# Patient Record
Sex: Male | Born: 1950 | Race: White | Hispanic: No | Marital: Married
Health system: Southern US, Community
[De-identification: ages and names within clinical notes are randomized; demographics above are authoritative.]

---

## 1997-11-22 ENCOUNTER — Emergency Department (HOSPITAL_COMMUNITY): Admission: EM | Admit: 1997-11-22 | Discharge: 1997-11-23 | Payer: Self-pay

## 1998-11-13 ENCOUNTER — Emergency Department (HOSPITAL_COMMUNITY): Admission: EM | Admit: 1998-11-13 | Discharge: 1998-11-14 | Payer: Self-pay | Admitting: Emergency Medicine

## 1998-11-14 ENCOUNTER — Encounter: Payer: Self-pay | Admitting: Emergency Medicine

## 2015-08-27 ENCOUNTER — Other Ambulatory Visit: Payer: Self-pay | Admitting: Orthopaedic Surgery

## 2015-08-27 DIAGNOSIS — M25562 Pain in left knee: Secondary | ICD-10-CM

## 2015-09-04 ENCOUNTER — Ambulatory Visit
Admission: RE | Admit: 2015-09-04 | Discharge: 2015-09-04 | Disposition: A | Discharge status: Home / Self Care | Payer: Medicare Other | Source: Ambulatory Visit | Attending: Orthopaedic Surgery | Admitting: Orthopaedic Surgery

## 2015-09-04 DIAGNOSIS — M25562 Pain in left knee: Secondary | ICD-10-CM

## 2016-03-15 ENCOUNTER — Ambulatory Visit (INDEPENDENT_AMBULATORY_CARE_PROVIDER_SITE_OTHER): Payer: Medicare Other | Admitting: Orthopaedic Surgery

## 2016-03-15 DIAGNOSIS — M1712 Unilateral primary osteoarthritis, left knee: Secondary | ICD-10-CM

## 2017-03-07 ENCOUNTER — Ambulatory Visit (INDEPENDENT_AMBULATORY_CARE_PROVIDER_SITE_OTHER): Payer: Medicare Other | Admitting: Orthopaedic Surgery

## 2017-03-07 DIAGNOSIS — M25562 Pain in left knee: Secondary | ICD-10-CM

## 2017-03-07 DIAGNOSIS — G8929 Other chronic pain: Secondary | ICD-10-CM | POA: Diagnosis not present

## 2017-03-07 DIAGNOSIS — M1712 Unilateral primary osteoarthritis, left knee: Secondary | ICD-10-CM | POA: Diagnosis not present

## 2017-03-07 MED ORDER — LIDOCAINE HCL 1 % IJ SOLN
3.0000 mL | INTRAMUSCULAR | Status: AC | PRN
  Administered 2017-03-07: 3 mL

## 2017-03-07 MED ORDER — METHYLPREDNISOLONE ACETATE 40 MG/ML IJ SUSP
40.0000 mg | INTRAMUSCULAR | Status: AC | PRN
  Administered 2017-03-07: 40 mg via INTRA_ARTICULAR

## 2017-03-07 NOTE — Progress Notes (Signed)
Office Visit Note   Patient: Terrance Carr           Date of Birth: 1950-08-23           MRN: [id number] Visit Date: 03/07/2017              Requested by: No referring provider defined for this encounter. PCP: No primary care provider on file.   Assessment & Plan: Visit Diagnoses:  1. Chronic pain of left knee   2. Unilateral primary osteoarthritis, left knee     Plan: I agree with trying a steroid injection again today and then ordering hyaluronic acid for his need to place in his left knee in 4 weeks now. He understands fully the risks and benefits of these injections. He tolerated the steroid injection his left knee well. We'll see him back in 4 weeks to provide a hyaluronic acid injection in his left knee.  Follow-Up Instructions: Return in about 4 weeks (around 04/04/2017).   Orders:  Orders Placed This Encounter  Procedures  . Large Joint Injection/Arthrocentesis   No orders of the defined types were placed in this encounter.     Procedures: Large Joint Inj Date/Time: 03/07/2017 11:02 AM Performed by: Kathryne Hitch Authorized by: Kathryne Hitch   Location:  Knee Site:  L knee Ultrasound Guidance: No   Fluoroscopic Guidance: No   Arthrogram: No   Medications:  3 mL lidocaine 1 %; 40 mg methylPREDNISolone acetate 40 MG/ML     Clinical Data: No additional findings.   Subjective: No chief complaint on file. The patient is well-known to [place]. We have not seen him in about a year. He has known osteoarthritis and degenerative joint disease of left knee. He has had pain flaring up again on that knee would like to consider steroid injection again and then later on hyaluronic acid injection because he had one of those last year that helped as well. His pain is daily and it hurts more with his activities daily living in terms of mobility of the left knee. Injections of helped in the past. He still not ready for knee replacement surgery yet. He is 66  years old wait until he is older and retired. Obviously if the injections are still helping I agree with this the way to continue to go. He has had no other acute changes his medical status.  HPI  Review of Systems He denies any headache, chest pain, shortness of breath, fever, chills, nausea, vomiting  Objective: Vital Signs: There were no vitals taken for this visit.  Physical Exam He is alert or 3 and in no acute distress Ortho Exam Examination of his left knee shows varus malalignment. He has excellent range of motion of that knee and it is ligamentously stable. He has medial joint line tenderness and some slight patellofemoral crepitation. Specialty Comments:  No specialty comments available.  Imaging: No results found.   PMFS History: Patient Active Problem List   Diagnosis Date Noted  . Unilateral primary osteoarthritis, left knee 03/07/2017  . Chronic pain of left knee 03/07/2017   No past medical history on file.  No family history on file.  No past surgical history on file. Social History   Occupational History  . Not on file.   Social History Main Topics  . Smoking status: Not on file  . Smokeless tobacco: Not on file  . Alcohol use Not on file  . Drug use: Unknown  . Sexual activity: Not on file

## 2017-04-04 ENCOUNTER — Encounter (INDEPENDENT_AMBULATORY_CARE_PROVIDER_SITE_OTHER): Payer: Self-pay | Admitting: Orthopaedic Surgery

## 2017-04-04 ENCOUNTER — Ambulatory Visit (INDEPENDENT_AMBULATORY_CARE_PROVIDER_SITE_OTHER): Payer: Medicare Other | Admitting: Orthopaedic Surgery

## 2017-04-04 DIAGNOSIS — M1712 Unilateral primary osteoarthritis, left knee: Secondary | ICD-10-CM

## 2017-04-04 MED ORDER — HYALURONAN 88 MG/4ML IX SOSY
88.0000 mg | PREFILLED_SYRINGE | INTRA_ARTICULAR | Status: AC | PRN
  Administered 2017-04-04: 88 mg via INTRA_ARTICULAR

## 2017-04-04 NOTE — Progress Notes (Signed)
   Procedure Note  Patient: Terrance Carr             Date of Birth: Jul 23, 1950           MRN: [id number]             Visit Date: 04/04/2017  Procedures: Visit Diagnoses: Unilateral primary osteoarthritis, left knee  Large Joint Inj: L knee on 04/04/2017 8:43 AM Indications: pain and diagnostic evaluation Details: 22 G 1.5 in needle, superolateral approach  Arthrogram: No  Medications: 88 mg Hyaluronan 88 MG/4ML Outcome: tolerated well, no immediate complications Procedure, treatment alternatives, risks and benefits explained, specific risks discussed. Consent was given by the patient. Immediately prior to procedure a time out was called to verify the correct patient, procedure, equipment, support staff and site/side marked as required. Patient was prepped and draped in the usual sterile fashion.    The patient is coming in today for scheduled hyaluronic acid injections in his left knee to treat moderate arthritic pain.  This is been well documented and he is already tried a steroid injection in that knee.  His pain is daily and it is affecting detrimentally his activities daily living, quality of life, his mobility.  We had talked already about hyaluronic acid injection he was given a handout on this prior to this admission.  He agreed to have this done so we have ordered an approved.  He is here to have that injection today.  His knee pain is mainly at the medial compartment of his knee.  He has full range of motion of the knee with the knee feels voluminously stable as well.  There is some slight patellofemoral crepitation with no effusion today.  He tolerated the Monovisc injection on his left knee.  We will see him back as needed or at least 2 months to see if this injection is helped always consider steroid injection in the future.  All questions and concerns were answered and addressed.

## 2018-01-11 IMAGING — MR MR KNEE*L* W/O CM
4 of 5 series · 19 of 40 positions shown · non-contrast
Comparison: None.

CLINICAL DATA: Left knee pain with swelling and popping.

EXAM:
MRI OF THE LEFT KNEE WITHOUT CONTRAST
TECHNIQUE: Multiplanar, multisequence MR imaging of the knee was performed. No
intravenous contrast was administered.

[Series 3: [id number]_fs_tra · axial · 3.5mm · 0.42mm/px · z∈[-34,+37]mm · 3 of 25 slices shown]
[im 4/25]
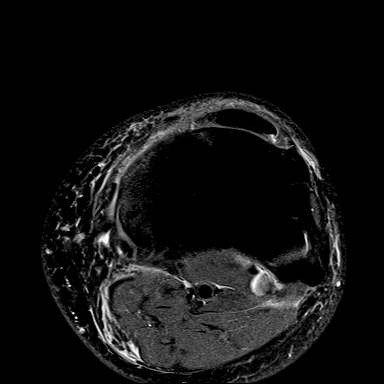
[im 14/25]
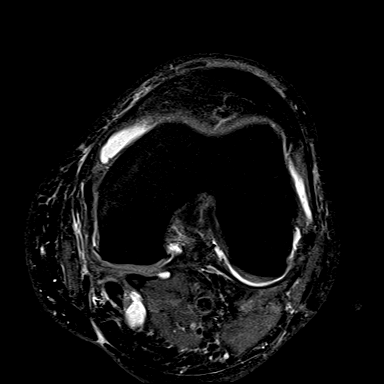
[im 21/25]
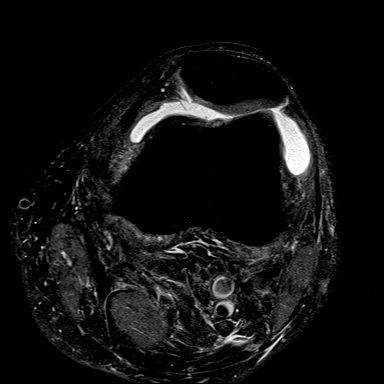

[Series 5: T2 fat-sat · coronal · 3.2mm · 0.62mm/px · 5 of 28 slices shown]
[im 1/28]
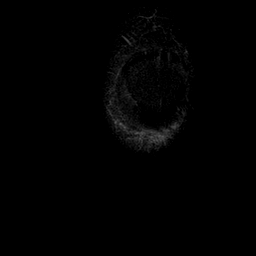
[im 4/28]
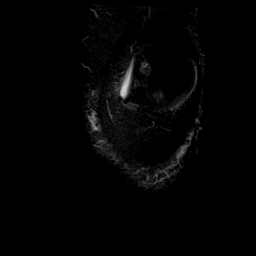
[im 8/28]
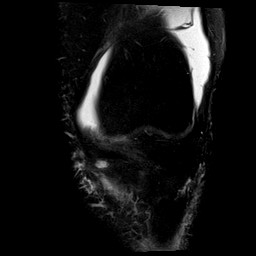
[im 16/28]
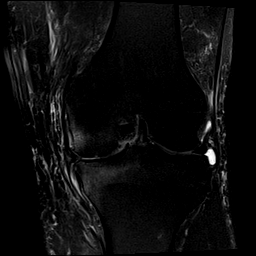
[im 24/28]
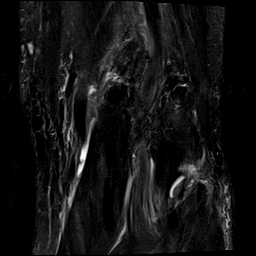

[Series 6: T1 · coronal · 3.2mm · 0.25mm/px · 3 of 28 slices shown]
[im 4/28]
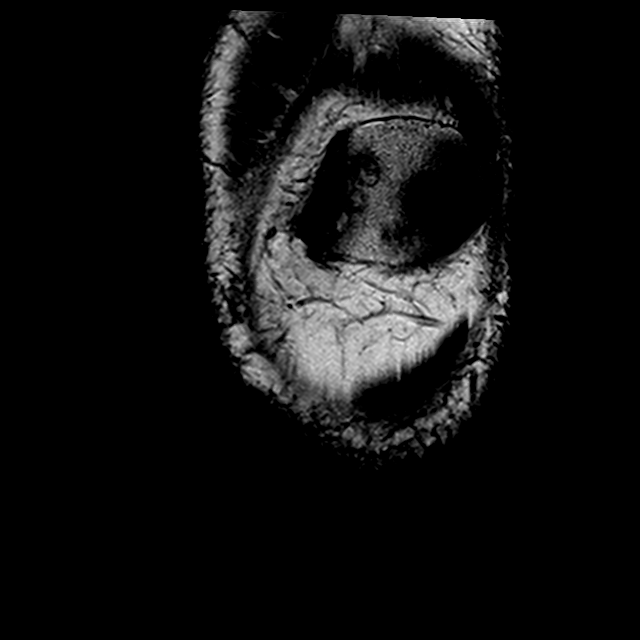
[im 16/28]
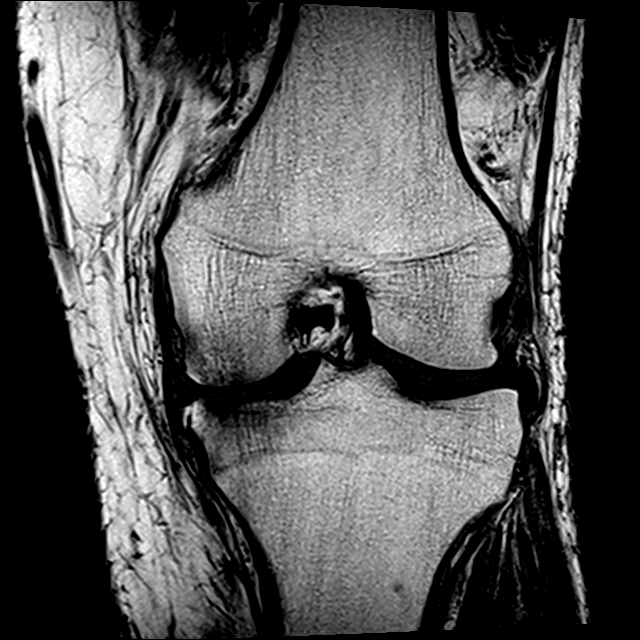
[im 24/28]
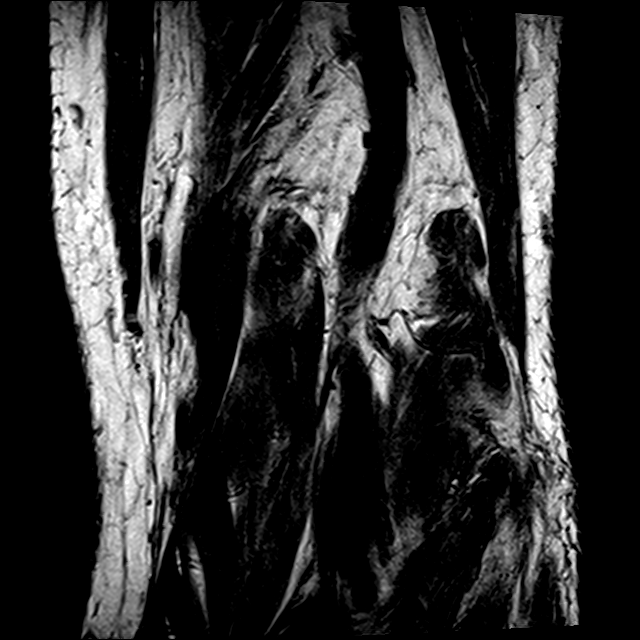

[Series 7: PD fat-sat · sagittal · 3.5mm · 0.25mm/px · 8 of 27 slices shown]
[im 1/27]
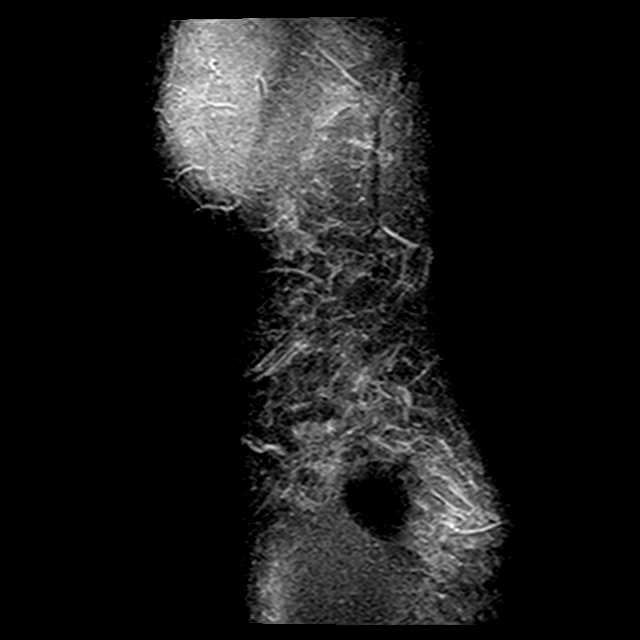
[im 4/27]
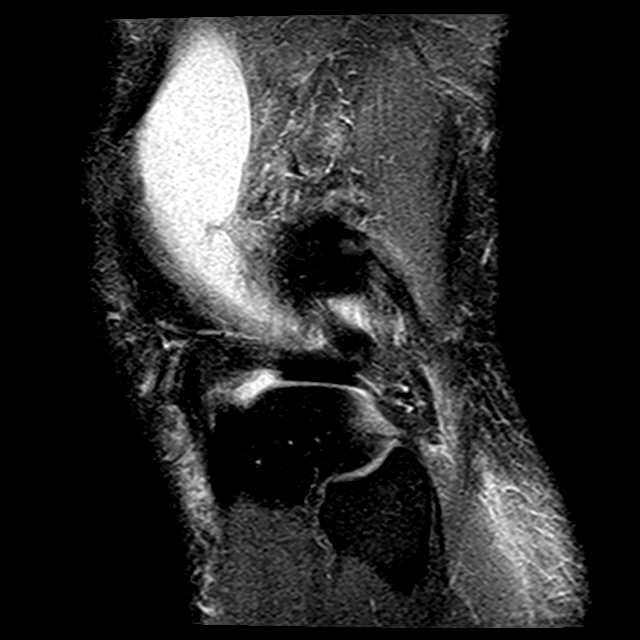
[im 8/27]
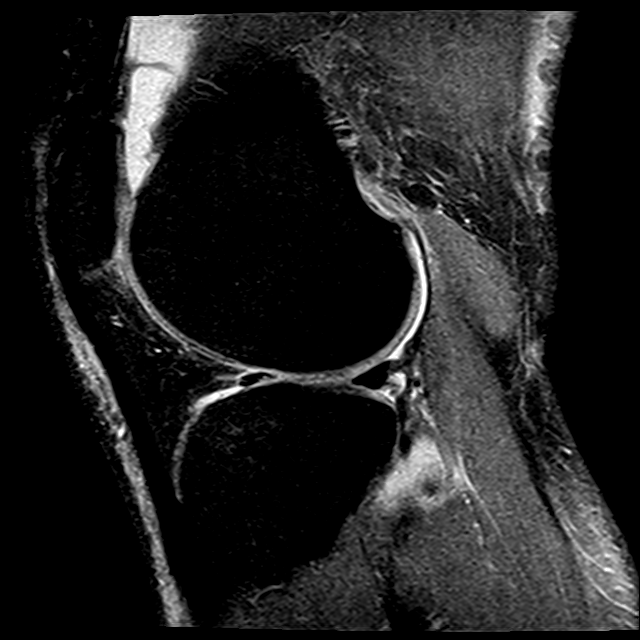
[im 12/27]
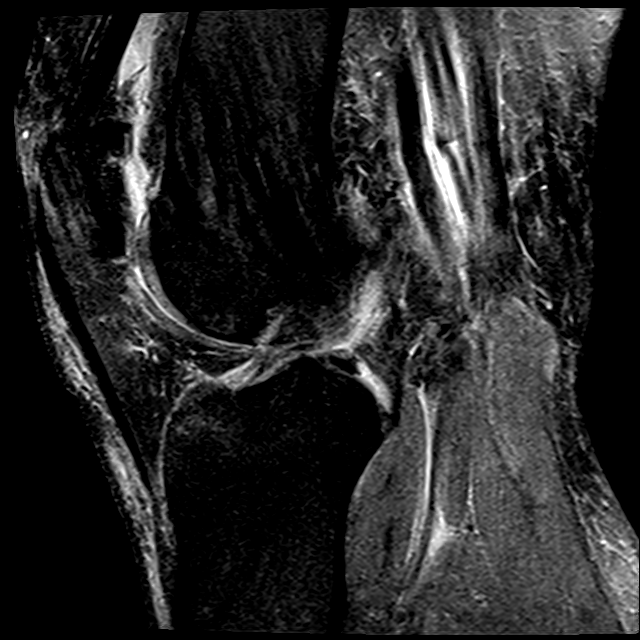
[im 15/27]
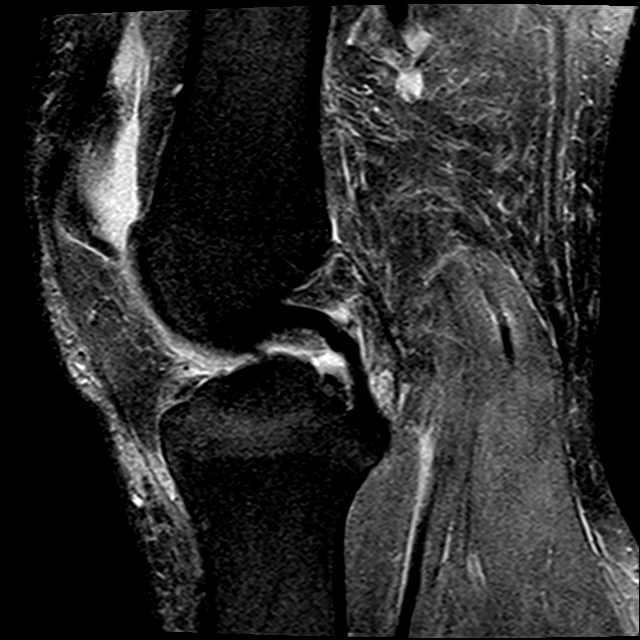
[im 19/27]
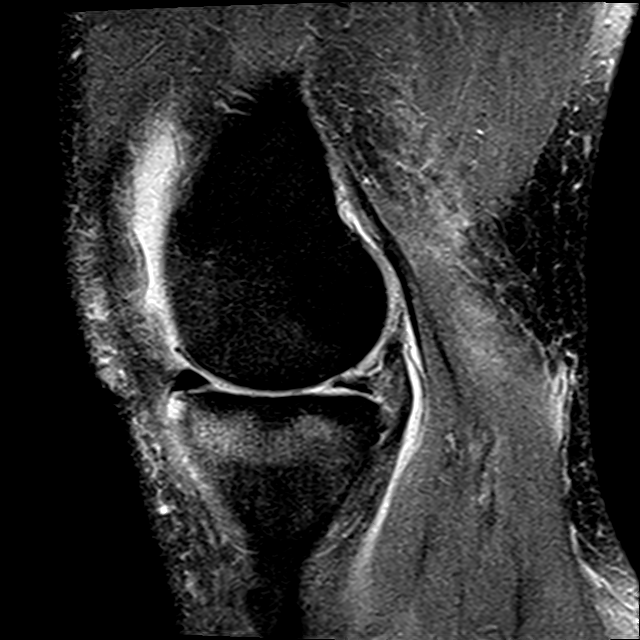
[im 23/27]
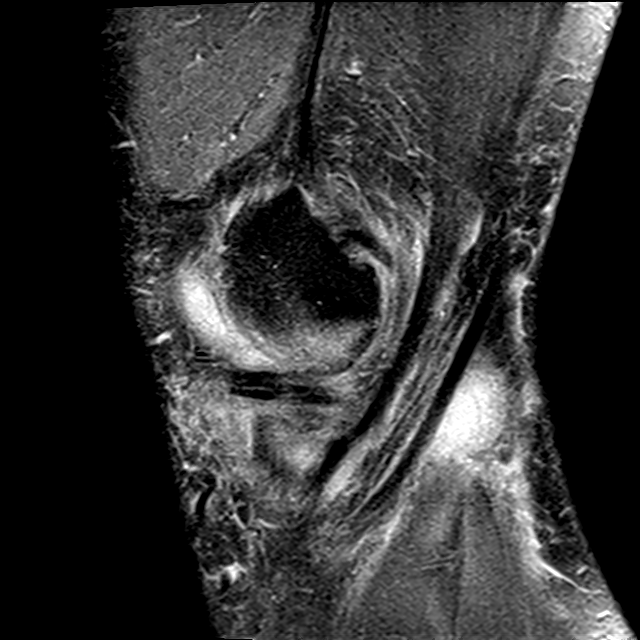
[im 27/27]
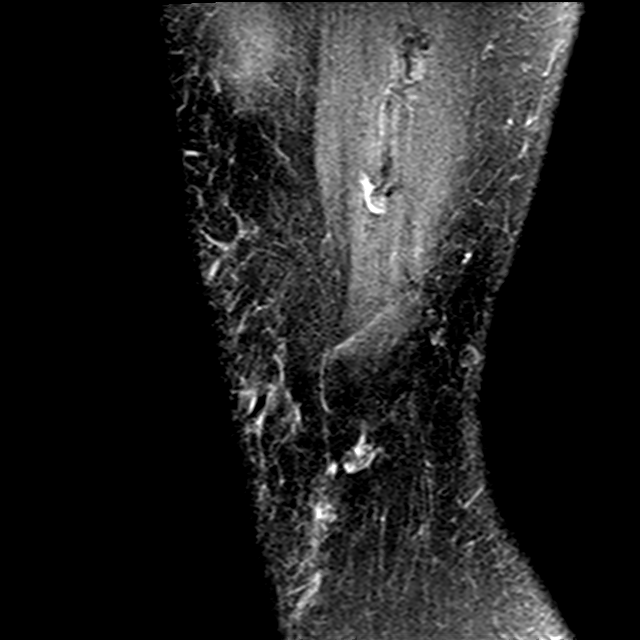

[19 of 40 positions shown; findings below may reference images not displayed]

FINDINGS: MENISCI

Medial meniscus: Complex tear of the posterior horn- body of the
medial meniscus.

Lateral meniscus:  Intact.

LIGAMENTS

Cruciates:  Intact ACL and PCL.

Collaterals: Medial collateral ligament is intact. Lateral
collateral ligament complex is intact.

CARTILAGE

Patellofemoral: Partial thickness cartilage loss of the medial
patellar facet with cartilage fissuring. Partial-thickness cartilage
loss of the lateral patellar facet.

Medial: High-grade partial-thickness cartilage loss with areas of
full-thickness cartilage loss of the medial femoral condyle and
medial tibial plateau with subchondral reactive marrow changes.

Lateral:  Chondromalacia of the lateral femorotibial compartment.

Joint:  Moderate joint effusion.  No plical thickening.

Popliteal Fossa: Small Baker cyst mild edema superficial to the
medial gastrocnemius muscle likely reflecting a leaking Baker cyst.
Intact popliteus tendon.

Extensor Mechanism:  Intact.

Bones: No other marrow signal abnormality. No acute fracture or
dislocation.
IMPRESSION: 1. Complex tear of the posterior horn-body of the medial meniscus.
2. Tricompartmental cartilage abnormalities most severe in the
medial femorotibial compartment as described above.

## 2018-05-01 ENCOUNTER — Ambulatory Visit (INDEPENDENT_AMBULATORY_CARE_PROVIDER_SITE_OTHER): Payer: Self-pay

## 2018-05-01 ENCOUNTER — Telehealth (INDEPENDENT_AMBULATORY_CARE_PROVIDER_SITE_OTHER): Payer: Self-pay

## 2018-05-01 ENCOUNTER — Ambulatory Visit (INDEPENDENT_AMBULATORY_CARE_PROVIDER_SITE_OTHER): Payer: Medicare Other

## 2018-05-01 ENCOUNTER — Ambulatory Visit (INDEPENDENT_AMBULATORY_CARE_PROVIDER_SITE_OTHER): Payer: Medicare Other | Admitting: Orthopaedic Surgery

## 2018-05-01 DIAGNOSIS — M25561 Pain in right knee: Secondary | ICD-10-CM

## 2018-05-01 DIAGNOSIS — M1711 Unilateral primary osteoarthritis, right knee: Secondary | ICD-10-CM

## 2018-05-01 DIAGNOSIS — M1712 Unilateral primary osteoarthritis, left knee: Secondary | ICD-10-CM | POA: Diagnosis not present

## 2018-05-01 DIAGNOSIS — M25562 Pain in left knee: Secondary | ICD-10-CM

## 2018-05-01 MED ORDER — METHYLPREDNISOLONE ACETATE 40 MG/ML IJ SUSP
40.0000 mg | INTRAMUSCULAR | Status: AC | PRN
  Administered 2018-05-01: 40 mg via INTRA_ARTICULAR

## 2018-05-01 MED ORDER — LIDOCAINE HCL 1 % IJ SOLN
3.0000 mL | INTRAMUSCULAR | Status: AC | PRN
  Administered 2018-05-01: 3 mL

## 2018-05-01 MED ORDER — METHYLPREDNISOLONE 4 MG PO TABS: ORAL_TABLET | ORAL | 0 refills | Status: AC

## 2018-05-01 MED ORDER — GABAPENTIN 300 MG PO CAPS: 300.0000 mg | ORAL_CAPSULE | Freq: Every day | ORAL | 1 refills | Status: DC

## 2018-05-01 NOTE — Progress Notes (Signed)
Office Visit Note   Patient: Terrance Carr           Date of Birth: February 24, 1951           MRN: [id number] Visit Date: 05/01/2018              Requested by: Medicine, [facility] No address on file PCP: Medicine, [facility]   Assessment & Plan: Visit Diagnoses:  1. Right knee pain, unspecified chronicity   2. Left knee pain, unspecified chronicity   3. Unilateral primary osteoarthritis, left knee   4. Unilateral primary osteoarthritis, right knee     Plan: I did place steroid injections in both knees today per his wishes.  He is a candidate for hyaluronic acid in both knees since the last injection lasted him for so long.  As far as his numbness in his left foot goes, I will try Neurontin 300 mg at night for the next month.  We will see him back in 4 weeks to hopefully place a hyaluronic acid injection in both knees.  All questions were answered and addressed.  We will see him back in 4 weeks.  Follow-Up Instructions: Return in about 4 weeks (around 05/29/2018).   Orders:  Orders Placed This Encounter  Procedures  . Large Joint Inj  . Large Joint Inj  . XR Knee 1-2 Views Left  . XR Knee 1-2 Views Right   No orders of the defined types were placed in this encounter.     Procedures: Large Joint Inj: R knee on 05/01/2018 4:05 PM Indications: diagnostic evaluation and pain Details: 22 G 1.5 in needle, superolateral approach  Arthrogram: No  Medications: 3 mL lidocaine 1 %; 40 mg methylPREDNISolone acetate 40 MG/ML Outcome: tolerated well, no immediate complications Procedure, treatment alternatives, risks and benefits explained, specific risks discussed. Consent was given by the patient. Immediately prior to procedure a time out was called to verify the correct patient, procedure, equipment, support staff and site/side marked as required. Patient was prepped and draped in the usual sterile fashion.   Large Joint Inj: L knee on  05/01/2018 4:06 PM Indications: diagnostic evaluation and pain Details: 22 G 1.5 in needle, superolateral approach  Arthrogram: No  Medications: 3 mL lidocaine 1 %; 40 mg methylPREDNISolone acetate 40 MG/ML Outcome: tolerated well, no immediate complications Procedure, treatment alternatives, risks and benefits explained, specific risks discussed. Consent was given by the patient. Immediately prior to procedure a time out was called to verify the correct patient, procedure, equipment, support staff and site/side marked as required. Patient was prepped and draped in the usual sterile fashion.       Clinical Data: No additional findings.   Subjective: Chief Complaint  Patient presents with  . Left Knee - Pain  . Right Knee - Pain  The patient is well-known to me.  It is been a while since we seen him though.  We last placed Monovisc in his left knee in 2018 and is done well for a long period of time.  For last for 5 months he is developed worsening left knee pain and some right knee pain now.  The left knee pain is definitely on the medial joint line where he has known severe osteoarthritis.  There is grinding in this area as well.  The right knee hurts globally around the patella itself.  His knee is had no injury to either knee.  About 4 months ago he did twist his right  knee and got his foot caught and since then has had numbness in the lateral aspect of his left foot.  He denies any weakness but definitely the sensation is diminished significantly.  He denies any back issues.  He is not a diabetic.  He denies any change in bowel bladder function.  HPI  Review of Systems He currently denies any headache, chest pain, shortness of breath, fever, chills, nausea, vomiting.  Objective: Vital Signs: There were no vitals taken for this visit.  Physical Exam He is alert or x3 and in no acute distress Ortho Exam Examination of his left knee shows significant patellofemoral crepitation.   There is varus malalignment.  His range of motion is full in the left knee feels ligamentously stable.  There is medial joint line tenderness.  The right knee has no effusion.  The patella tracks well.  There is slight patellofemoral arthritic changes.  There is no medial lateral joint line tenderness.  There is no effusion.  His Lockman's exam is negative.  Examination of his left foot does show subjective numbness in the lateral aspect of the foot but there is no evidence of foot drop or weakness. Specialty Comments:  No specialty comments available.  Imaging: Xr Knee 1-2 Views Left  Result Date: 05/01/2018 2 views the left knee show varus malalignment with almost complete loss of the medial joint space.  There are sclerotic changes and patellofemoral arthritic changes.  There is otherwise no acute findings and no effusion.  Xr Knee 1-2 Views Right  Result Date: 05/01/2018 2 views of the right knee show no acute findings.  The alignment is normal    PMFS History: Patient Active Problem List   Diagnosis Date Noted  . Unilateral primary osteoarthritis, left knee 03/07/2017  . Chronic pain of left knee 03/07/2017   No past medical history on file.  No family history on file.  No past surgical history on file. Social History   Occupational History  . Not on file  Tobacco Use  . Smoking status: Not on file  Substance and Sexual Activity  . Alcohol use: Not on file  . Drug use: Not on file  . Sexual activity: Not on file

## 2018-05-01 NOTE — Telephone Encounter (Signed)
error 

## 2018-05-01 NOTE — Telephone Encounter (Signed)
Bilateral gel injections  

## 2018-05-02 NOTE — Telephone Encounter (Signed)
Noted  

## 2018-05-13 ENCOUNTER — Telehealth (INDEPENDENT_AMBULATORY_CARE_PROVIDER_SITE_OTHER): Payer: Self-pay

## 2018-05-13 NOTE — Telephone Encounter (Signed)
Submitted VOB for Monovisc, bilateral knee. 

## 2018-05-30 ENCOUNTER — Ambulatory Visit (INDEPENDENT_AMBULATORY_CARE_PROVIDER_SITE_OTHER): Payer: Medicare Other | Admitting: Orthopaedic Surgery

## 2018-05-30 ENCOUNTER — Encounter (INDEPENDENT_AMBULATORY_CARE_PROVIDER_SITE_OTHER): Payer: Self-pay | Admitting: Orthopaedic Surgery

## 2018-05-30 DIAGNOSIS — M25562 Pain in left knee: Secondary | ICD-10-CM

## 2018-05-30 DIAGNOSIS — M1712 Unilateral primary osteoarthritis, left knee: Secondary | ICD-10-CM | POA: Diagnosis not present

## 2018-05-30 DIAGNOSIS — M1711 Unilateral primary osteoarthritis, right knee: Secondary | ICD-10-CM

## 2018-05-30 DIAGNOSIS — M25561 Pain in right knee: Secondary | ICD-10-CM

## 2018-05-30 MED ORDER — HYALURONAN 88 MG/4ML IX SOSY
88.0000 mg | PREFILLED_SYRINGE | INTRA_ARTICULAR | Status: AC | PRN
  Administered 2018-05-30: 88 mg via INTRA_ARTICULAR

## 2018-05-30 NOTE — Progress Notes (Signed)
   Procedure Note  Patient: Terrance Carr             Date of Birth: [birth year]           MRN: [id number]             Visit Date: 05/30/2018  Procedures: Visit Diagnoses: Right knee pain, unspecified chronicity  Left knee pain, unspecified chronicity  Unilateral primary osteoarthritis, left knee  Unilateral primary osteoarthritis, right knee  Large Joint Inj: R knee on 05/30/2018 3:55 PM Indications: diagnostic evaluation and pain Details: 22 G 1.5 in needle, superolateral approach  Arthrogram: No  Medications: 88 mg Hyaluronan 88 MG/4ML Outcome: tolerated well, no immediate complications Procedure, treatment alternatives, risks and benefits explained, specific risks discussed. Consent was given by the patient. Immediately prior to procedure a time out was called to verify the correct patient, procedure, equipment, support staff and site/side marked as required. Patient was prepped and draped in the usual sterile fashion.   Large Joint Inj: L knee on 05/30/2018 3:55 PM Indications: diagnostic evaluation and pain Details: 22 G 1.5 in needle, superolateral approach  Arthrogram: No  Medications: 88 mg Hyaluronan 88 MG/4ML Outcome: tolerated well, no immediate complications Procedure, treatment alternatives, risks and benefits explained, specific risks discussed. Consent was given by the patient. Immediately prior to procedure a time out was called to verify the correct patient, procedure, equipment, support staff and site/side marked as required. Patient was prepped and draped in the usual sterile fashion.    The patient is here today for scheduled bilateral knee hyaluronic acid injections with Monovisc to treat the pain from osteoarthritis.  He has had success with these injections in the past.  He is already had steroid injections as well.  On examination both knees have slight varus malalignment.  There is medial joint line tenderness with both knees.  There is no effusion.  Both  knees are ligamentously stable with full range of motion.  There is slight patellofemoral arthritic changes with crepitation on exam.  He tolerated the Monovisc injections in both knees without difficulties.  All question concerns were answered and addressed.  It is always close to be as needed.  He understands that if things worsen at any time he can come back and see us.

## 2018-07-18 ENCOUNTER — Other Ambulatory Visit (INDEPENDENT_AMBULATORY_CARE_PROVIDER_SITE_OTHER): Payer: Self-pay | Admitting: Orthopaedic Surgery

## 2018-07-18 NOTE — Telephone Encounter (Signed)
Please advise 

## 2019-04-21 ENCOUNTER — Telehealth: Payer: Self-pay

## 2019-04-21 ENCOUNTER — Other Ambulatory Visit: Payer: Self-pay

## 2019-04-21 ENCOUNTER — Ambulatory Visit (INDEPENDENT_AMBULATORY_CARE_PROVIDER_SITE_OTHER): Payer: Medicare Other | Admitting: Orthopaedic Surgery

## 2019-04-21 DIAGNOSIS — M1711 Unilateral primary osteoarthritis, right knee: Secondary | ICD-10-CM

## 2019-04-21 DIAGNOSIS — G8929 Other chronic pain: Secondary | ICD-10-CM | POA: Diagnosis not present

## 2019-04-21 DIAGNOSIS — M1712 Unilateral primary osteoarthritis, left knee: Secondary | ICD-10-CM | POA: Diagnosis not present

## 2019-04-21 DIAGNOSIS — M25562 Pain in left knee: Secondary | ICD-10-CM | POA: Diagnosis not present

## 2019-04-21 DIAGNOSIS — M25561 Pain in right knee: Secondary | ICD-10-CM

## 2019-04-21 MED ORDER — LIDOCAINE HCL 1 % IJ SOLN
3.0000 mL | INTRAMUSCULAR | Status: AC | PRN
  Administered 2019-04-21: 3 mL

## 2019-04-21 MED ORDER — METHYLPREDNISOLONE ACETATE 40 MG/ML IJ SUSP
40.0000 mg | INTRAMUSCULAR | Status: AC | PRN
  Administered 2019-04-21: 40 mg via INTRA_ARTICULAR

## 2019-04-21 NOTE — Progress Notes (Signed)
Office Visit Note   Patient: Terrance Carr           Date of Birth: Aug 09, 1950           MRN: [id number] Visit Date: 04/21/2019              Requested by: Medicine, [facility] No address on file PCP: Medicine, [facility]   Assessment & Plan: Visit Diagnoses:  1. Unilateral primary osteoarthritis, left knee   2. Unilateral primary osteoarthritis, right knee   3. Chronic pain of left knee   4. Chronic pain of right knee     Plan: I did place a steroid injection in both knees today per the patient's request.  He understands fully the risk and benefits of injections.  He is now diabetic.  He tolerated them well.  We will order Monovisc again for both knees since this is helped decrease his knee pain over the long run.  We will see him back in 4 weeks to hopefully place Monovisc in both knees.  Follow-Up Instructions: Return in about 4 weeks (around 05/19/2019).   Orders:  Orders Placed This Encounter  Procedures  . Large Joint Inj  . Large Joint Inj   No orders of the defined types were placed in this encounter.     Procedures: Large Joint Inj: R knee on 04/21/2019 4:08 PM Indications: diagnostic evaluation and pain Details: 22 G 1.5 in needle, superolateral approach  Arthrogram: No  Medications: 3 mL lidocaine 1 %; 40 mg methylPREDNISolone acetate 40 MG/ML Outcome: tolerated well, no immediate complications Procedure, treatment alternatives, risks and benefits explained, specific risks discussed. Consent was given by the patient. Immediately prior to procedure a time out was called to verify the correct patient, procedure, equipment, support staff and site/side marked as required. Patient was prepped and draped in the usual sterile fashion.   Large Joint Inj: L knee on 04/21/2019 4:08 PM Indications: diagnostic evaluation and pain Details: 22 G 1.5 in needle, superolateral approach  Arthrogram: No  Medications: 3 mL  lidocaine 1 %; 40 mg methylPREDNISolone acetate 40 MG/ML Outcome: tolerated well, no immediate complications Procedure, treatment alternatives, risks and benefits explained, specific risks discussed. Consent was given by the patient. Immediately prior to procedure a time out was called to verify the correct patient, procedure, equipment, support staff and site/side marked as required. Patient was prepped and draped in the usual sterile fashion.       Clinical Data: No additional findings.   Subjective: Chief Complaint  Patient presents with  . Left Knee - Pain  . Right Knee - Pain  The patient is well-known to [place].  He has known osteoarthritis in both knees.  He last had hyaluronic acid injections with Monovisc 10 months ago in both knees.  He would like to have a steroid injection today to treat the pain from an acute flareup of osteoarthritis pain and then set him up for repeat hyaluronic acid injections because this is helped maintain him from a pain standpoint.  He is still not interested in knee replacement surgery since the injections are helping and is able to keep them spread out.  He has had no other acute changes in medical status.  He works on activity modification and quad strengthening.  He is try to work on weight loss as well.  HPI  Review of Systems  He currently denies any headache, chest pain, shortness of breath, fever, chills, nausea, vomiting Objective: Vital  Signs: There were no vitals taken for this visit.  Physical Exam He is alert and orient x3 and in no acute distress Ortho Exam Examination of both knees shows slight varus malalignment.  Neither knee has an effusion.  Both knees have patellofemoral crepitation with medial joint line tenderness.  Both knees have good range of motion. Specialty Comments:  No specialty comments available.  Imaging: No results found.   PMFS History: Patient Active Problem List   Diagnosis Date Noted  . Unilateral primary  osteoarthritis, left knee 03/07/2017  . Chronic pain of left knee 03/07/2017   No past medical history on file.  No family history on file.  No past surgical history on file. Social History   Occupational History  . Not on file  Tobacco Use  . Smoking status: Not on file  Substance and Sexual Activity  . Alcohol use: Not on file  . Drug use: Not on file  . Sexual activity: Not on file

## 2019-04-21 NOTE — Telephone Encounter (Signed)
Bilateral Knee injections

## 2019-05-01 NOTE — Telephone Encounter (Signed)
Noted  

## 2019-05-02 ENCOUNTER — Telehealth: Payer: Self-pay

## 2019-05-02 NOTE — Telephone Encounter (Signed)
Submitted VOB for Monovisc, bilateral knee. 

## 2019-05-07 ENCOUNTER — Telehealth: Payer: Self-pay

## 2019-05-07 NOTE — Telephone Encounter (Signed)
Approved for Monovisc, bilateral knee. [place] deductible has been met Secondary insurance [employer]) will pick up remaining eligible expenses at 100%. No Co-pay No PA required  Appt. 05/20/2019 with Dr. Ninfa Linden

## 2019-05-20 ENCOUNTER — Encounter: Payer: Self-pay | Admitting: Orthopaedic Surgery

## 2019-05-20 ENCOUNTER — Ambulatory Visit (INDEPENDENT_AMBULATORY_CARE_PROVIDER_SITE_OTHER): Payer: Medicare Other | Admitting: Orthopaedic Surgery

## 2019-05-20 DIAGNOSIS — M1712 Unilateral primary osteoarthritis, left knee: Secondary | ICD-10-CM | POA: Diagnosis not present

## 2019-05-20 DIAGNOSIS — M1711 Unilateral primary osteoarthritis, right knee: Secondary | ICD-10-CM | POA: Diagnosis not present

## 2019-05-20 MED ORDER — HYALURONAN 88 MG/4ML IX SOSY
88.0000 mg | PREFILLED_SYRINGE | INTRA_ARTICULAR | Status: AC | PRN
  Administered 2019-05-20: 08:00:00 88 mg via INTRA_ARTICULAR

## 2019-05-20 NOTE — Progress Notes (Signed)
   Procedure Note  Patient: Terrance Carr             Date of Birth: 12-Jan-1951           MRN: [id number]             Visit Date: 05/20/2019  Procedures: Visit Diagnoses:  1. Unilateral primary osteoarthritis, left knee   2. Unilateral primary osteoarthritis, right knee     Large Joint Inj: R knee on 05/20/2019 8:16 AM Indications: diagnostic evaluation and pain Details: 22 G 1.5 in needle, superolateral approach  Arthrogram: No  Medications: 88 mg Hyaluronan 88 MG/4ML Outcome: tolerated well, no immediate complications Procedure, treatment alternatives, risks and benefits explained, specific risks discussed. Consent was given by the patient. Immediately prior to procedure a time out was called to verify the correct patient, procedure, equipment, support staff and site/side marked as required. Patient was prepped and draped in the usual sterile fashion.   Large Joint Inj: L knee on 05/20/2019 8:16 AM Indications: diagnostic evaluation and pain Details: 22 G 1.5 in needle, superolateral approach  Arthrogram: No  Medications: 88 mg Hyaluronan 88 MG/4ML Outcome: tolerated well, no immediate complications Procedure, treatment alternatives, risks and benefits explained, specific risks discussed. Consent was given by the patient. Immediately prior to procedure a time out was called to verify the correct patient, procedure, equipment, support staff and site/side marked as required. Patient was prepped and draped in the usual sterile fashion.    The patient comes in today for scheduled hyaluronic acid injections with Monovisc in both knees to treat the pain from osteoarthritis.  He had these last about a year ago.  He is doing well otherwise.  Other forms of conservative treatment including steroid injections have not provided the relief that these types of injections have.  He said no other acute change in medical status.  On examination of both knees today there is no effusion.  He is  got good range of motion both knees.  Both knees are ligamentously stable.  I did place Monovisc in both knees without difficulty.  All question concerns were answered and addressed.  Follow-up will be as needed.

## 2020-07-27 DEATH — deceased
